# Patient Record
Sex: Male | Born: 1970 | Hispanic: Refuse to answer | Marital: Married | Smoking: Never smoker
Health system: Southern US, Community
[De-identification: ages and names within clinical notes are randomized; demographics above are authoritative.]

## PROBLEM LIST (undated history)

## (undated) HISTORY — PX: NECK SURGERY: SHX720

## (undated) HISTORY — PX: TONSILLECTOMY: SUR1361

---

## 2016-04-09 ENCOUNTER — Ambulatory Visit (INDEPENDENT_AMBULATORY_CARE_PROVIDER_SITE_OTHER): Payer: Worker's Compensation | Admitting: Physician Assistant

## 2016-04-09 ENCOUNTER — Ambulatory Visit (INDEPENDENT_AMBULATORY_CARE_PROVIDER_SITE_OTHER): Payer: Worker's Compensation

## 2016-04-09 ENCOUNTER — Telehealth: Payer: Self-pay | Admitting: *Deleted

## 2016-04-09 VITALS — BP 120/82 | HR 68 | Temp 98.2°F | Resp 18 | Ht 72.5 in | Wt 320.0 lb

## 2016-04-09 DIAGNOSIS — Z026 Encounter for examination for insurance purposes: Secondary | ICD-10-CM

## 2016-04-09 DIAGNOSIS — M25521 Pain in right elbow: Secondary | ICD-10-CM | POA: Diagnosis not present

## 2016-04-09 DIAGNOSIS — W19XXXA Unspecified fall, initial encounter: Secondary | ICD-10-CM | POA: Diagnosis not present

## 2016-04-09 MED ORDER — IBUPROFEN 600 MG PO TABS: 600.0000 mg | ORAL_TABLET | Freq: Three times a day (TID) | ORAL | 0 refills | Status: AC | PRN

## 2016-04-09 NOTE — Telephone Encounter (Signed)
Left message on voicemail for pt to pickup form for work regarding worker's comp.

## 2016-04-09 NOTE — Progress Notes (Signed)
  04/10/2016 4:31 PM   DOB: 01/04/1971 / MRN: [id number]  SUBJECTIVE:  Jeffrey Stanley is a 46 y.o. male presenting for elbow pain that started a chair he was sitting in collapsed and he land on the his left elbow. This occurred yesterday while he was at work.  He complains of left hand pain and was having some numbness just after the injury however this numbness has resolved. Denies any previous injury to the elbow.   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    Review of Systems  Constitutional: Negative for chills and fever.  Musculoskeletal: Positive for joint pain and myalgias. Negative for back pain, falls and neck pain.  Skin: Negative for itching and rash.  Neurological: Negative for dizziness.    The problem list and medications were reviewed and updated by myself where necessary and exist elsewhere in the encounter.   OBJECTIVE:  BP 120/82 (BP Location: Right Arm, Patient Position: Sitting, Cuff Size: Small)   Pulse 68   Temp 98.2 F (36.8 C) (Oral)   Resp 18   Ht 6' 0.5" (1.842 m)   Wt (!) 320 lb (145.2 kg)   SpO2 97%   BMI 42.80 kg/m   Physical Exam  Constitutional: He is oriented to person, place, and time.  Cardiovascular: Normal rate.   Pulmonary/Chest: Effort normal and breath sounds normal.  Musculoskeletal: Normal range of motion. He exhibits no edema or tenderness.       Arms: Neurological: He is alert and oriented to person, place, and time. He has normal strength and normal reflexes. He displays normal reflexes. No cranial nerve deficit or sensory deficit. He exhibits normal muscle tone. Coordination normal.    No results found for this or any previous visit (from the past 72 hour(s)).  Dg Elbow Complete Right  Result Date: 04/09/2016 CLINICAL DATA:  Fall. EXAM: RIGHT ELBOW - COMPLETE 3+ VIEW COMPARISON:  No recent. FINDINGS: No acute bony abnormality. No focal abnormality.  Soft tissues unremarkable. IMPRESSION: No acute abnormality . Electronically Signed   By: Maisie Fushomas  Register   On: 04/09/2016 16:38    ASSESSMENT AND PLAN:  Jeffrey Stanley was seen today for elbow pain.  Diagnoses and all orders for this visit:  Elbow pain, right: Rads negative for fracture.  No loss of function on exam.  I am placing him in an arm sling and prescribed 600 mg Ibuprofen q6-8.  Will see him back in 3 days for return to work, as he is out for now.  -     DG Elbow Complete Right; Future -     ibuprofen (ADVIL,MOTRIN) 600 MG tablet; Take 1 tablet (600 mg total) by mouth every 8 (eight) hours as needed.  Fall, initial encounter  Encounter related to worker's compensation claim    The patient is advised to call or return to clinic if he does not see an improvement in symptoms, or to seek the care of the closest emergency department if he worsens with the above plan.   Deliah BostonMichael Clark, MHS, PA-C [facility] 04/10/2016 4:31 PM

## 2016-04-09 NOTE — Patient Instructions (Addendum)
Wear your arm sling for the next few days during the waking hours. Perform gentle range of motion exercises twice daily for about 3-5 minutes.  Take ibuprofen every 6-8 hours for pain.  Come back on Monday so I can re-evaluate you for return to work.     IF you received an x-ray today, you will receive an invoice from [facility]. Please contact [facility] at [phone] with questions or concerns regarding your invoice.   IF you received labwork today, you will receive an invoice from [facility]. Please contact [facility] at [phone] with questions or concerns regarding your invoice.   Our billing staff will not be able to assist you with questions regarding bills from these companies.  You will be contacted with the lab results as soon as they are available. The fastest way to get your results is to activate your My Chart account. Instructions are located on the last page of this paperwork. If you have not heard from us regarding the results in 2 weeks, please contact this office.

## 2016-04-12 ENCOUNTER — Ambulatory Visit (INDEPENDENT_AMBULATORY_CARE_PROVIDER_SITE_OTHER): Payer: Worker's Compensation | Admitting: Physician Assistant

## 2016-04-12 ENCOUNTER — Encounter: Payer: Self-pay | Admitting: Physician Assistant

## 2016-04-12 VITALS — BP 138/86 | HR 68 | Temp 97.9°F | Resp 16 | Ht 72.0 in | Wt 324.0 lb

## 2016-04-12 DIAGNOSIS — Z026 Encounter for examination for insurance purposes: Secondary | ICD-10-CM

## 2016-04-12 DIAGNOSIS — M25521 Pain in right elbow: Secondary | ICD-10-CM

## 2016-04-12 NOTE — Patient Instructions (Signed)
     IF you received an x-ray today, you will receive an invoice from [facility]. Please contact [facility] at [phone] with questions or concerns regarding your invoice.   IF you received labwork today, you will receive an invoice from [facility]. Please contact [facility] at [phone] with questions or concerns regarding your invoice.   Our billing staff will not be able to assist you with questions regarding bills from these companies.  You will be contacted with the lab results as soon as they are available. The fastest way to get your results is to activate your My Chart account. Instructions are located on the last page of this paperwork. If you have not heard from us regarding the results in 2 weeks, please contact this office.     

## 2016-04-12 NOTE — Progress Notes (Addendum)
  04/12/2016 10:24 AM   DOB: 03/15/1970 / MRN: [id number]  SUBJECTIVE:  Jeffrey Stanley is a 46 y.o. male presenting a right elbow injury that occurred at work.  I saw him about 3 days ago for this and exam and rads were reassuring.  Today he tells me his is feeling much improved, and has been following all medical advise.    He is allergic to keflex [cephalexin]; lisinopril; valium [diazepam]; and flexeril [cyclobenzaprine].   He  has no past medical history on file.    He  reports that he has never smoked. He has never used smokeless tobacco. He  has no sexual activity history on file. The patient  has a past surgical history that includes Neck surgery and Tonsillectomy.  His family history is not on file.  Review of Systems  Constitutional: Negative for fever.  Gastrointestinal: Negative for nausea.  Musculoskeletal: Positive for joint pain (improved). Negative for myalgias.  Skin: Negative for rash.  Neurological: Negative for dizziness.    The problem list and medications were reviewed and updated by myself where necessary and exist elsewhere in the encounter.   OBJECTIVE:  BP 138/86   Pulse 68   Temp 97.9 F (36.6 C) (Oral)   Resp 16   Ht 6' (1.829 m)   Wt (!) 324 lb (147 kg)   SpO2 99%   BMI 43.94 kg/m   Physical Exam  Constitutional: He is oriented to person, place, and time.  Cardiovascular: Normal rate and regular rhythm.   Pulmonary/Chest: Effort normal and breath sounds normal.  Musculoskeletal: Normal range of motion. He exhibits no edema, tenderness or deformity.  Neurological: He is alert and oriented to person, place, and time. He has normal reflexes. No cranial nerve deficit. He exhibits normal muscle tone. Coordination normal.  Skin: Skin is warm and dry.    No results found for this or any previous visit (from the past 72 hour(s)).  No results found.  ASSESSMENT AND PLAN:  Jeffrey Stanley was seen today for follow-up.  Diagnoses and all orders for this  visit:  Elbow pain, right: Improving.  He is back to work without restriction.  He may take ibuprofen as needed at this time.   Encounter related to worker's compensation claim    The patient is advised to call or return to clinic if he does not see an improvement in symptoms, or to seek the care of the closest emergency department if he worsens with the above plan.   Deliah BostonMichael Hoy Fallert, MHS, PA-C [facility] 04/12/2016 10:24 AM

## 2017-09-17 IMAGING — DX DG ELBOW COMPLETE 3+V*R*
4 series · 4 of 4 positions shown · non-contrast
Comparison: No recent.

CLINICAL DATA: Fall.

EXAM:
RIGHT ELBOW - COMPLETE 3+ VIEW

[elbow ap]
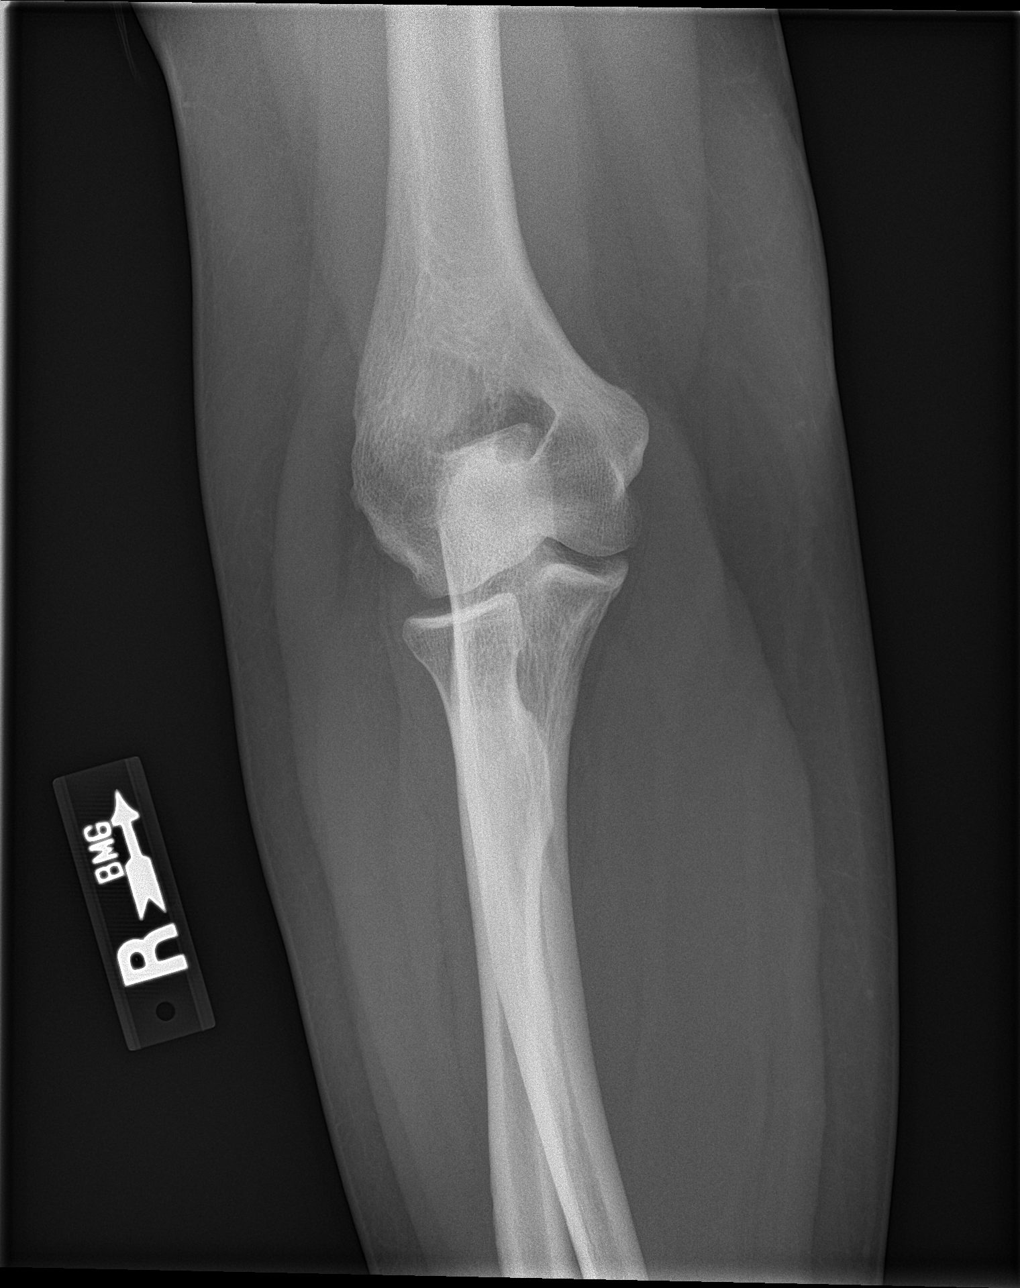

[elbow obl (1 of 2)]
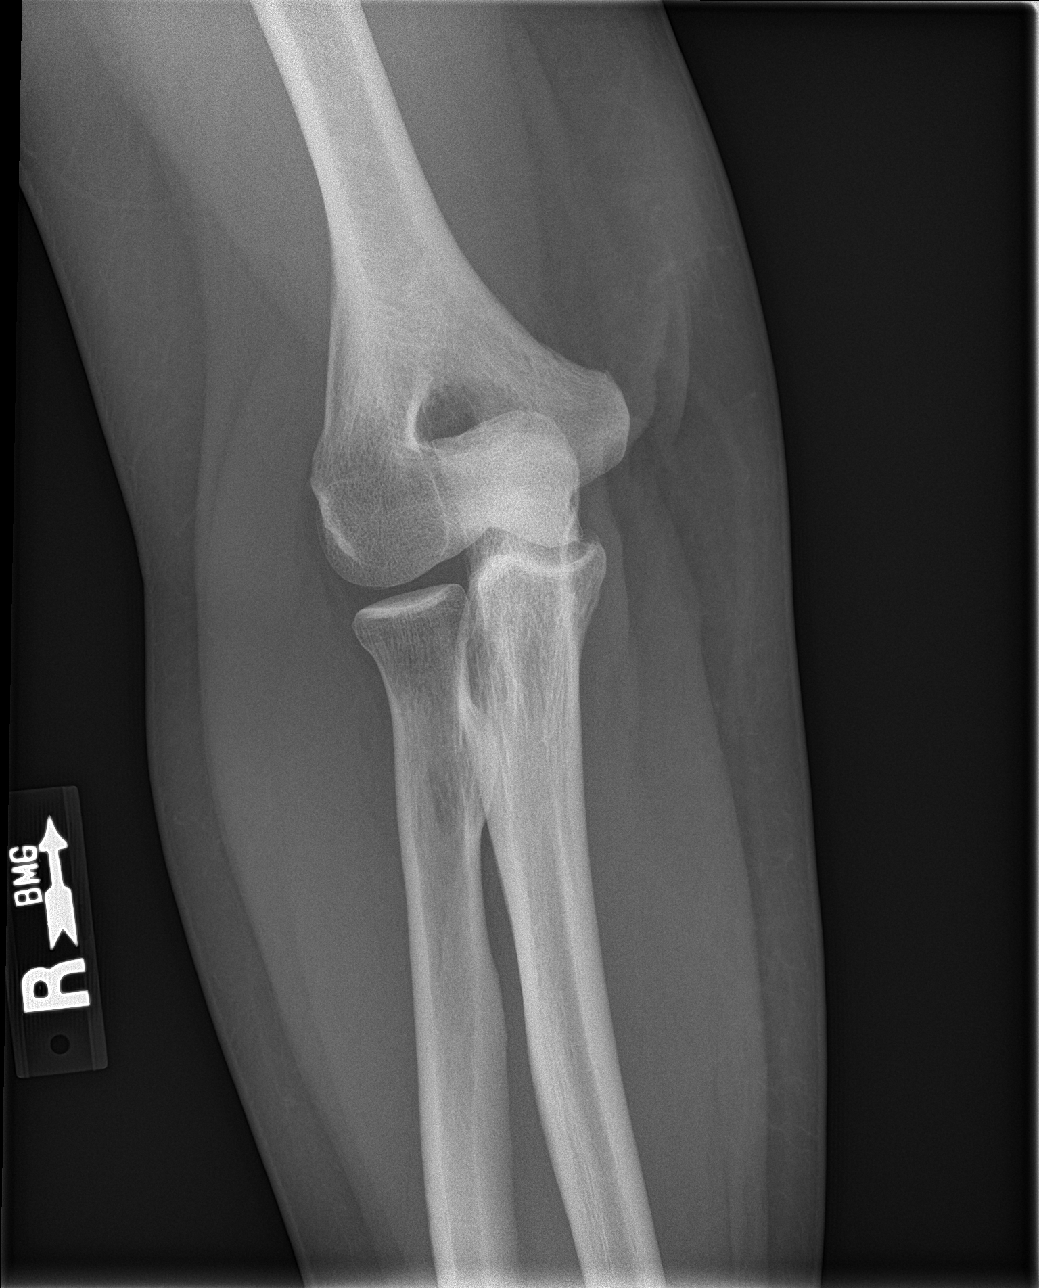

[elbow obl (2 of 2)]
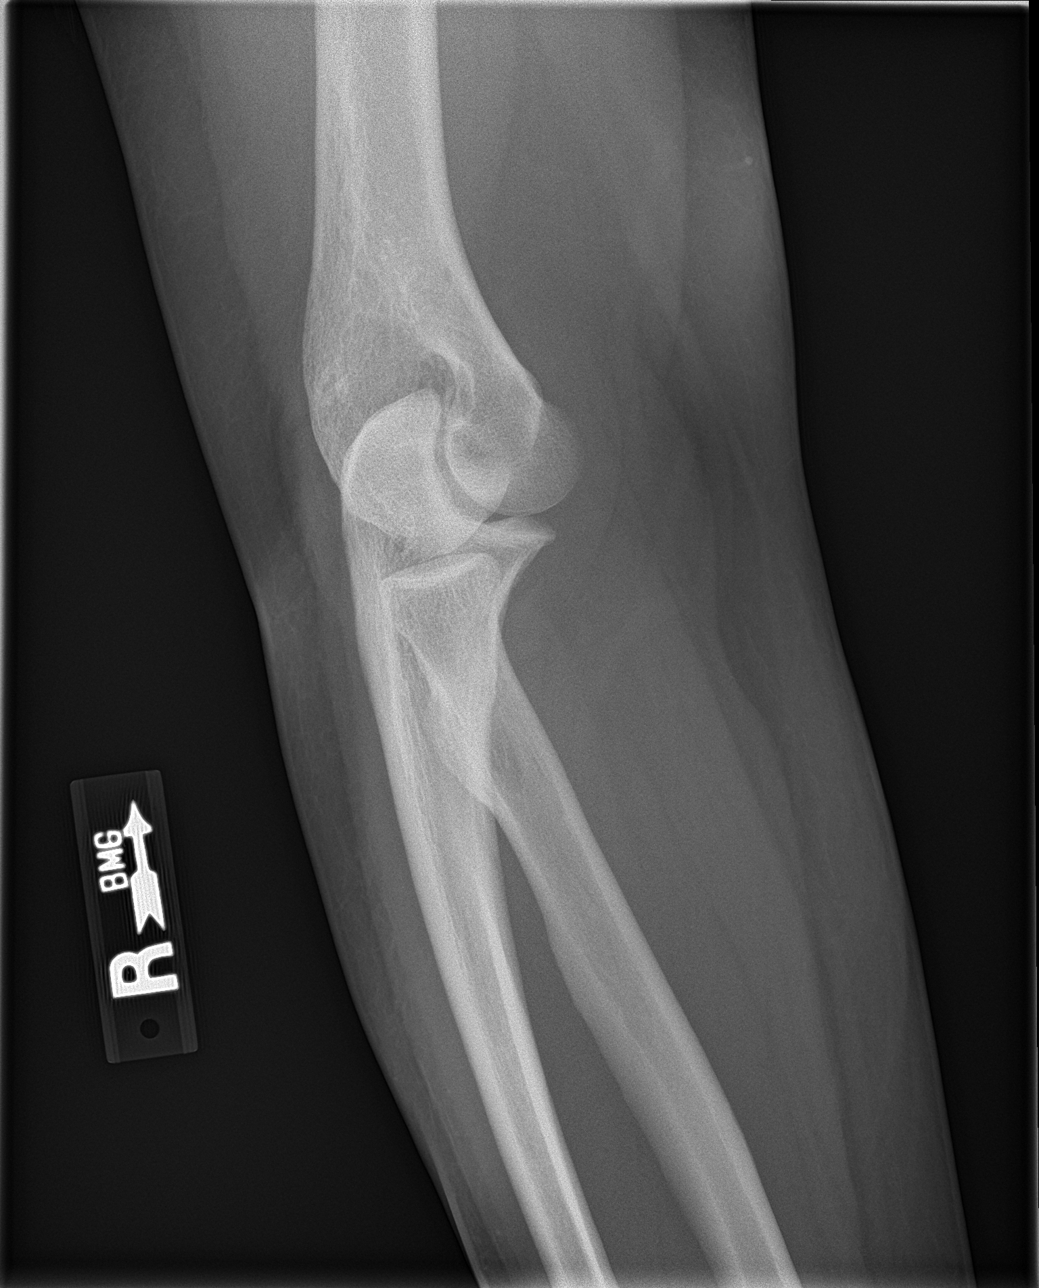

[elbow lat]
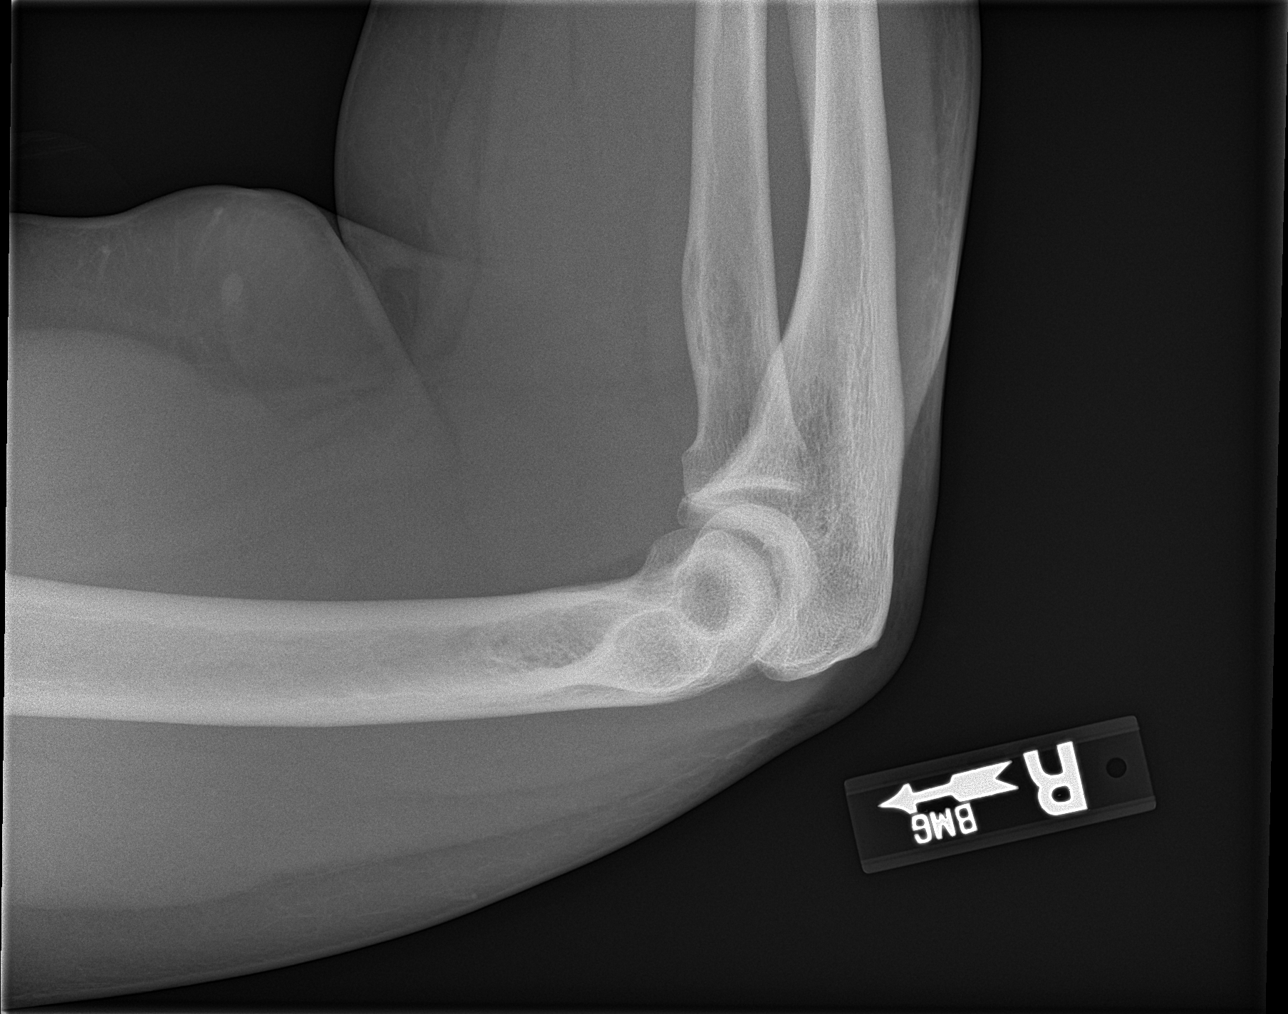

[4 of 4 positions shown; findings below may reference images not displayed]

FINDINGS: No acute bony abnormality. No focal abnormality. Soft tissues
unremarkable.
IMPRESSION: No acute abnormality .
